# Patient Record
Sex: Female | Born: 1965 | Race: White | Hispanic: No | Marital: Single | Smoking: Former smoker
Health system: Southern US, Community
[De-identification: ages and names within clinical notes are randomized; demographics above are authoritative.]

## PROBLEM LIST (undated history)

## (undated) DIAGNOSIS — F419 Anxiety disorder, unspecified: Secondary | ICD-10-CM

## (undated) DIAGNOSIS — T7840XA Allergy, unspecified, initial encounter: Secondary | ICD-10-CM

## (undated) DIAGNOSIS — E7800 Pure hypercholesterolemia, unspecified: Secondary | ICD-10-CM

## (undated) DIAGNOSIS — E063 Autoimmune thyroiditis: Secondary | ICD-10-CM

## (undated) DIAGNOSIS — E785 Hyperlipidemia, unspecified: Secondary | ICD-10-CM

## (undated) DIAGNOSIS — K5900 Constipation, unspecified: Secondary | ICD-10-CM

## (undated) DIAGNOSIS — M810 Age-related osteoporosis without current pathological fracture: Secondary | ICD-10-CM

## (undated) DIAGNOSIS — L900 Lichen sclerosus et atrophicus: Secondary | ICD-10-CM

## (undated) DIAGNOSIS — E039 Hypothyroidism, unspecified: Secondary | ICD-10-CM

## (undated) HISTORY — DX: Autoimmune thyroiditis: E06.3

## (undated) HISTORY — DX: Hypothyroidism, unspecified: E03.9

## (undated) HISTORY — PX: CERVIX SURGERY: SHX593

## (undated) HISTORY — DX: Lichen sclerosus et atrophicus: L90.0

## (undated) HISTORY — DX: Allergy, unspecified, initial encounter: T78.40XA

## (undated) HISTORY — DX: Constipation, unspecified: K59.00

## (undated) HISTORY — DX: Age-related osteoporosis without current pathological fracture: M81.0

## (undated) HISTORY — DX: Hyperlipidemia, unspecified: E78.5

## (undated) HISTORY — DX: Pure hypercholesterolemia, unspecified: E78.00

## (undated) HISTORY — DX: Anxiety disorder, unspecified: F41.9

---

## 2007-06-02 ENCOUNTER — Ambulatory Visit (HOSPITAL_COMMUNITY): Admission: RE | Admit: 2007-06-02 | Discharge: 2007-06-02 | Payer: Self-pay | Admitting: Obstetrics and Gynecology

## 2010-05-13 ENCOUNTER — Other Ambulatory Visit (HOSPITAL_COMMUNITY): Payer: Self-pay | Admitting: Obstetrics and Gynecology

## 2010-05-13 DIAGNOSIS — Z139 Encounter for screening, unspecified: Secondary | ICD-10-CM

## 2010-05-16 ENCOUNTER — Ambulatory Visit (HOSPITAL_COMMUNITY)
Admission: RE | Admit: 2010-05-16 | Discharge: 2010-05-16 | Disposition: A | Discharge status: Home / Self Care | Payer: BC Managed Care – PPO | Source: Ambulatory Visit | Attending: Obstetrics and Gynecology | Admitting: Obstetrics and Gynecology

## 2010-05-16 ENCOUNTER — Ambulatory Visit (HOSPITAL_COMMUNITY): Payer: Self-pay

## 2010-05-16 DIAGNOSIS — Z139 Encounter for screening, unspecified: Secondary | ICD-10-CM

## 2010-05-16 DIAGNOSIS — Z1231 Encounter for screening mammogram for malignant neoplasm of breast: Secondary | ICD-10-CM | POA: Insufficient documentation

## 2011-04-07 ENCOUNTER — Other Ambulatory Visit (HOSPITAL_COMMUNITY): Payer: Self-pay | Admitting: Obstetrics and Gynecology

## 2011-04-07 DIAGNOSIS — Z139 Encounter for screening, unspecified: Secondary | ICD-10-CM

## 2011-05-19 ENCOUNTER — Ambulatory Visit (HOSPITAL_COMMUNITY)
Admission: RE | Admit: 2011-05-19 | Discharge: 2011-05-19 | Disposition: A | Discharge status: Home / Self Care | Payer: BC Managed Care – PPO | Source: Ambulatory Visit | Attending: Obstetrics and Gynecology | Admitting: Obstetrics and Gynecology

## 2011-05-19 DIAGNOSIS — Z139 Encounter for screening, unspecified: Secondary | ICD-10-CM

## 2011-05-19 DIAGNOSIS — N6459 Other signs and symptoms in breast: Secondary | ICD-10-CM | POA: Insufficient documentation

## 2011-05-19 DIAGNOSIS — Z1231 Encounter for screening mammogram for malignant neoplasm of breast: Secondary | ICD-10-CM | POA: Insufficient documentation

## 2011-05-20 ENCOUNTER — Other Ambulatory Visit: Payer: Self-pay | Admitting: Obstetrics and Gynecology

## 2011-05-20 DIAGNOSIS — R928 Other abnormal and inconclusive findings on diagnostic imaging of breast: Secondary | ICD-10-CM

## 2011-06-04 ENCOUNTER — Ambulatory Visit (HOSPITAL_COMMUNITY)
Admission: RE | Admit: 2011-06-04 | Discharge: 2011-06-04 | Disposition: A | Discharge status: Home / Self Care | Payer: BC Managed Care – PPO | Source: Ambulatory Visit | Attending: Obstetrics and Gynecology | Admitting: Obstetrics and Gynecology

## 2011-06-04 ENCOUNTER — Other Ambulatory Visit: Payer: Self-pay | Admitting: Obstetrics and Gynecology

## 2011-06-04 DIAGNOSIS — R928 Other abnormal and inconclusive findings on diagnostic imaging of breast: Secondary | ICD-10-CM

## 2011-09-05 ENCOUNTER — Encounter (INDEPENDENT_AMBULATORY_CARE_PROVIDER_SITE_OTHER): Payer: Self-pay | Admitting: *Deleted

## 2011-10-31 ENCOUNTER — Encounter (INDEPENDENT_AMBULATORY_CARE_PROVIDER_SITE_OTHER): Payer: Self-pay | Admitting: *Deleted

## 2011-10-31 ENCOUNTER — Other Ambulatory Visit (INDEPENDENT_AMBULATORY_CARE_PROVIDER_SITE_OTHER): Payer: Self-pay | Admitting: *Deleted

## 2011-10-31 ENCOUNTER — Telehealth (INDEPENDENT_AMBULATORY_CARE_PROVIDER_SITE_OTHER): Payer: Self-pay | Admitting: *Deleted

## 2011-10-31 DIAGNOSIS — Z1211 Encounter for screening for malignant neoplasm of colon: Secondary | ICD-10-CM

## 2011-10-31 DIAGNOSIS — Z800 Family history of malignant neoplasm of digestive organs: Secondary | ICD-10-CM
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
## 2011-11-03 MED ORDER — PEG-KCL-NACL-NASULF-NA ASC-C 100 G PO SOLR: 1.0000 | Freq: Once | ORAL | Status: DC

## 2011-12-25 ENCOUNTER — Ambulatory Visit: Admit: 2011-12-25 | Payer: BC Managed Care – PPO | Admitting: Internal Medicine

## 2011-12-25 SURGERY — COLONOSCOPY
Anesthesia: Moderate Sedation

## 2012-01-07 ENCOUNTER — Other Ambulatory Visit (HOSPITAL_COMMUNITY): Payer: Self-pay | Admitting: Internal Medicine

## 2012-01-07 DIAGNOSIS — N200 Calculus of kidney: Secondary | ICD-10-CM

## 2012-01-12 ENCOUNTER — Ambulatory Visit (HOSPITAL_COMMUNITY): Payer: BC Managed Care – PPO

## 2012-05-31 ENCOUNTER — Other Ambulatory Visit (HOSPITAL_COMMUNITY): Payer: Self-pay | Admitting: Internal Medicine

## 2012-05-31 DIAGNOSIS — Z139 Encounter for screening, unspecified: Secondary | ICD-10-CM

## 2012-06-10 ENCOUNTER — Other Ambulatory Visit (HOSPITAL_COMMUNITY): Payer: Self-pay | Admitting: Internal Medicine

## 2012-06-10 ENCOUNTER — Ambulatory Visit (HOSPITAL_COMMUNITY): Payer: BC Managed Care – PPO

## 2012-06-10 DIAGNOSIS — Z1231 Encounter for screening mammogram for malignant neoplasm of breast: Secondary | ICD-10-CM

## 2012-06-28 ENCOUNTER — Ambulatory Visit
Admission: RE | Admit: 2012-06-28 | Discharge: 2012-06-28 | Disposition: A | Discharge status: Home / Self Care | Payer: BC Managed Care – PPO | Source: Ambulatory Visit | Attending: Internal Medicine | Admitting: Internal Medicine

## 2012-06-28 DIAGNOSIS — Z1231 Encounter for screening mammogram for malignant neoplasm of breast: Secondary | ICD-10-CM

## 2016-03-05 DIAGNOSIS — J0100 Acute maxillary sinusitis, unspecified: Secondary | ICD-10-CM | POA: Diagnosis not present

## 2016-04-02 DIAGNOSIS — E039 Hypothyroidism, unspecified: Secondary | ICD-10-CM | POA: Diagnosis not present

## 2016-05-07 DIAGNOSIS — R300 Dysuria: Secondary | ICD-10-CM | POA: Diagnosis not present

## 2016-05-16 DIAGNOSIS — Z01419 Encounter for gynecological examination (general) (routine) without abnormal findings: Secondary | ICD-10-CM | POA: Diagnosis not present

## 2016-05-16 DIAGNOSIS — Z713 Dietary counseling and surveillance: Secondary | ICD-10-CM | POA: Diagnosis not present

## 2016-06-11 DIAGNOSIS — Z713 Dietary counseling and surveillance: Secondary | ICD-10-CM | POA: Diagnosis not present

## 2016-06-12 DIAGNOSIS — Z1231 Encounter for screening mammogram for malignant neoplasm of breast: Secondary | ICD-10-CM | POA: Diagnosis not present

## 2016-06-26 DIAGNOSIS — L02224 Furuncle of groin: Secondary | ICD-10-CM | POA: Diagnosis not present

## 2016-06-26 DIAGNOSIS — L578 Other skin changes due to chronic exposure to nonionizing radiation: Secondary | ICD-10-CM | POA: Diagnosis not present

## 2016-06-26 DIAGNOSIS — L648 Other androgenic alopecia: Secondary | ICD-10-CM | POA: Diagnosis not present

## 2016-07-09 DIAGNOSIS — Z713 Dietary counseling and surveillance: Secondary | ICD-10-CM | POA: Diagnosis not present

## 2016-11-13 DIAGNOSIS — N952 Postmenopausal atrophic vaginitis: Secondary | ICD-10-CM | POA: Diagnosis not present

## 2016-11-13 DIAGNOSIS — L298 Other pruritus: Secondary | ICD-10-CM | POA: Diagnosis not present

## 2016-12-25 DIAGNOSIS — E063 Autoimmune thyroiditis: Secondary | ICD-10-CM | POA: Diagnosis not present

## 2017-02-24 ENCOUNTER — Emergency Department (HOSPITAL_COMMUNITY)
Admission: EM | Admit: 2017-02-24 | Discharge: 2017-02-24 | Disposition: A | Discharge status: Home / Self Care | Payer: 59 | Attending: Emergency Medicine | Admitting: Emergency Medicine

## 2017-02-24 ENCOUNTER — Emergency Department (HOSPITAL_COMMUNITY): Payer: 59

## 2017-02-24 DIAGNOSIS — Y998 Other external cause status: Secondary | ICD-10-CM | POA: Insufficient documentation

## 2017-02-24 DIAGNOSIS — S199XXA Unspecified injury of neck, initial encounter: Secondary | ICD-10-CM | POA: Diagnosis not present

## 2017-02-24 DIAGNOSIS — S4992XA Unspecified injury of left shoulder and upper arm, initial encounter: Secondary | ICD-10-CM | POA: Diagnosis not present

## 2017-02-24 DIAGNOSIS — T148XXA Other injury of unspecified body region, initial encounter: Secondary | ICD-10-CM | POA: Diagnosis not present

## 2017-02-24 DIAGNOSIS — Y92410 Unspecified street and highway as the place of occurrence of the external cause: Secondary | ICD-10-CM | POA: Diagnosis not present

## 2017-02-24 DIAGNOSIS — M25512 Pain in left shoulder: Secondary | ICD-10-CM | POA: Diagnosis not present

## 2017-02-24 DIAGNOSIS — Y9389 Activity, other specified: Secondary | ICD-10-CM | POA: Insufficient documentation

## 2017-02-24 DIAGNOSIS — M5489 Other dorsalgia: Secondary | ICD-10-CM | POA: Diagnosis not present

## 2017-02-24 DIAGNOSIS — M542 Cervicalgia: Secondary | ICD-10-CM | POA: Insufficient documentation

## 2017-02-24 DIAGNOSIS — S3992XA Unspecified injury of lower back, initial encounter: Secondary | ICD-10-CM | POA: Insufficient documentation

## 2017-02-24 MED ORDER — METHOCARBAMOL 500 MG PO TABS: 500.0000 mg | ORAL_TABLET | Freq: Two times a day (BID) | ORAL | 0 refills | Status: DC

## 2017-02-24 MED ORDER — NAPROXEN 500 MG PO TABS: 500.0000 mg | ORAL_TABLET | Freq: Two times a day (BID) | ORAL | 0 refills | Status: DC

## 2017-02-24 MED ORDER — METHOCARBAMOL 500 MG PO TABS
750.0000 mg | ORAL_TABLET | Freq: Once | ORAL | Status: AC
Start: 2017-02-24 — End: 2017-02-24
  Administered 2017-02-24: 750 mg via ORAL
  Filled 2017-02-24: qty 2

## 2017-02-24 MED ORDER — IBUPROFEN 400 MG PO TABS
600.0000 mg | ORAL_TABLET | Freq: Once | ORAL | Status: AC
  Administered 2017-02-24: 600 mg via ORAL
  Filled 2017-02-24: qty 1

## 2017-02-24 NOTE — ED Notes (Signed)
Patient transported to X-ray 
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
## 2017-02-27 DIAGNOSIS — E039 Hypothyroidism, unspecified: Secondary | ICD-10-CM | POA: Diagnosis not present

## 2017-03-13 DIAGNOSIS — E039 Hypothyroidism, unspecified: Secondary | ICD-10-CM | POA: Diagnosis not present

## 2017-03-13 DIAGNOSIS — S199XXA Unspecified injury of neck, initial encounter: Secondary | ICD-10-CM | POA: Diagnosis not present

## 2017-03-20 DIAGNOSIS — Z0000 Encounter for general adult medical examination without abnormal findings: Secondary | ICD-10-CM | POA: Diagnosis not present

## 2017-03-20 DIAGNOSIS — E063 Autoimmune thyroiditis: Secondary | ICD-10-CM | POA: Diagnosis not present

## 2017-03-20 DIAGNOSIS — M542 Cervicalgia: Secondary | ICD-10-CM | POA: Diagnosis not present

## 2017-04-22 DIAGNOSIS — M545 Low back pain, unspecified: Secondary | ICD-10-CM | POA: Insufficient documentation

## 2017-05-10 DIAGNOSIS — N390 Urinary tract infection, site not specified: Secondary | ICD-10-CM | POA: Diagnosis not present

## 2017-06-30 DIAGNOSIS — N898 Other specified noninflammatory disorders of vagina: Secondary | ICD-10-CM | POA: Diagnosis not present

## 2017-07-07 DIAGNOSIS — N952 Postmenopausal atrophic vaginitis: Secondary | ICD-10-CM | POA: Diagnosis not present

## 2017-09-23 DIAGNOSIS — E782 Mixed hyperlipidemia: Secondary | ICD-10-CM | POA: Diagnosis not present

## 2017-09-23 DIAGNOSIS — E063 Autoimmune thyroiditis: Secondary | ICD-10-CM | POA: Diagnosis not present

## 2017-09-23 DIAGNOSIS — E039 Hypothyroidism, unspecified: Secondary | ICD-10-CM | POA: Diagnosis not present

## 2017-09-23 DIAGNOSIS — Z0000 Encounter for general adult medical examination without abnormal findings: Secondary | ICD-10-CM | POA: Diagnosis not present

## 2017-09-23 DIAGNOSIS — M542 Cervicalgia: Secondary | ICD-10-CM | POA: Diagnosis not present

## 2017-09-30 DIAGNOSIS — E6609 Other obesity due to excess calories: Secondary | ICD-10-CM | POA: Diagnosis not present

## 2017-09-30 DIAGNOSIS — Z6836 Body mass index (BMI) 36.0-36.9, adult: Secondary | ICD-10-CM | POA: Diagnosis not present

## 2017-09-30 DIAGNOSIS — E782 Mixed hyperlipidemia: Secondary | ICD-10-CM | POA: Diagnosis not present

## 2017-09-30 DIAGNOSIS — E038 Other specified hypothyroidism: Secondary | ICD-10-CM | POA: Diagnosis not present

## 2017-10-06 DIAGNOSIS — R300 Dysuria: Secondary | ICD-10-CM | POA: Diagnosis not present

## 2017-10-06 DIAGNOSIS — N343 Urethral syndrome, unspecified: Secondary | ICD-10-CM | POA: Diagnosis not present

## 2017-10-06 DIAGNOSIS — Z6834 Body mass index (BMI) 34.0-34.9, adult: Secondary | ICD-10-CM | POA: Diagnosis not present

## 2017-10-24 DIAGNOSIS — Z6834 Body mass index (BMI) 34.0-34.9, adult: Secondary | ICD-10-CM | POA: Diagnosis not present

## 2017-10-24 DIAGNOSIS — R300 Dysuria: Secondary | ICD-10-CM | POA: Diagnosis not present

## 2017-10-24 DIAGNOSIS — N760 Acute vaginitis: Secondary | ICD-10-CM | POA: Diagnosis not present

## 2017-11-02 DIAGNOSIS — R300 Dysuria: Secondary | ICD-10-CM | POA: Diagnosis not present

## 2017-11-02 DIAGNOSIS — N390 Urinary tract infection, site not specified: Secondary | ICD-10-CM | POA: Diagnosis not present

## 2017-11-02 DIAGNOSIS — R319 Hematuria, unspecified: Secondary | ICD-10-CM | POA: Diagnosis not present

## 2017-11-04 DIAGNOSIS — Z6836 Body mass index (BMI) 36.0-36.9, adult: Secondary | ICD-10-CM | POA: Diagnosis not present

## 2017-11-04 DIAGNOSIS — N390 Urinary tract infection, site not specified: Secondary | ICD-10-CM | POA: Diagnosis not present

## 2017-11-04 DIAGNOSIS — R300 Dysuria: Secondary | ICD-10-CM | POA: Diagnosis not present

## 2017-11-04 DIAGNOSIS — E669 Obesity, unspecified: Secondary | ICD-10-CM | POA: Diagnosis not present

## 2017-11-11 DIAGNOSIS — N898 Other specified noninflammatory disorders of vagina: Secondary | ICD-10-CM | POA: Diagnosis not present

## 2017-11-11 DIAGNOSIS — R300 Dysuria: Secondary | ICD-10-CM | POA: Diagnosis not present

## 2017-11-11 DIAGNOSIS — Z23 Encounter for immunization: Secondary | ICD-10-CM | POA: Diagnosis not present

## 2017-11-11 DIAGNOSIS — B373 Candidiasis of vulva and vagina: Secondary | ICD-10-CM | POA: Diagnosis not present

## 2017-11-11 DIAGNOSIS — N760 Acute vaginitis: Secondary | ICD-10-CM | POA: Diagnosis not present

## 2017-11-11 DIAGNOSIS — E1165 Type 2 diabetes mellitus with hyperglycemia: Secondary | ICD-10-CM | POA: Diagnosis not present

## 2017-11-12 DIAGNOSIS — R300 Dysuria: Secondary | ICD-10-CM | POA: Diagnosis not present

## 2017-11-12 DIAGNOSIS — E669 Obesity, unspecified: Secondary | ICD-10-CM | POA: Diagnosis not present

## 2017-11-12 DIAGNOSIS — M542 Cervicalgia: Secondary | ICD-10-CM | POA: Diagnosis not present

## 2017-11-12 DIAGNOSIS — Z0000 Encounter for general adult medical examination without abnormal findings: Secondary | ICD-10-CM | POA: Diagnosis not present

## 2017-11-25 DIAGNOSIS — Z6834 Body mass index (BMI) 34.0-34.9, adult: Secondary | ICD-10-CM | POA: Diagnosis not present

## 2017-11-25 DIAGNOSIS — L900 Lichen sclerosus et atrophicus: Secondary | ICD-10-CM | POA: Diagnosis not present

## 2017-12-02 DIAGNOSIS — L908 Other atrophic disorders of skin: Secondary | ICD-10-CM | POA: Diagnosis not present

## 2017-12-02 DIAGNOSIS — L900 Lichen sclerosus et atrophicus: Secondary | ICD-10-CM | POA: Diagnosis not present

## 2017-12-13 DIAGNOSIS — H1032 Unspecified acute conjunctivitis, left eye: Secondary | ICD-10-CM | POA: Diagnosis not present

## 2017-12-13 DIAGNOSIS — Z6833 Body mass index (BMI) 33.0-33.9, adult: Secondary | ICD-10-CM | POA: Diagnosis not present

## 2017-12-13 DIAGNOSIS — B001 Herpesviral vesicular dermatitis: Secondary | ICD-10-CM | POA: Diagnosis not present

## 2017-12-28 DIAGNOSIS — Z01419 Encounter for gynecological examination (general) (routine) without abnormal findings: Secondary | ICD-10-CM | POA: Diagnosis not present

## 2017-12-28 DIAGNOSIS — Z6833 Body mass index (BMI) 33.0-33.9, adult: Secondary | ICD-10-CM | POA: Diagnosis not present

## 2018-01-06 DIAGNOSIS — E6609 Other obesity due to excess calories: Secondary | ICD-10-CM | POA: Diagnosis not present

## 2018-01-06 DIAGNOSIS — L900 Lichen sclerosus et atrophicus: Secondary | ICD-10-CM | POA: Diagnosis not present

## 2018-01-06 DIAGNOSIS — Z6833 Body mass index (BMI) 33.0-33.9, adult: Secondary | ICD-10-CM | POA: Diagnosis not present

## 2018-01-25 DIAGNOSIS — E039 Hypothyroidism, unspecified: Secondary | ICD-10-CM | POA: Diagnosis not present

## 2019-04-14 LAB — LIPID PANEL
Cholesterol: 285 — AB (ref 0–200)
HDL: 53 (ref 35–70)
LDL Cholesterol: 187
Triglycerides: 244 — AB (ref 40–160)

## 2019-04-14 LAB — TSH
TSH: 0.02 — AB (ref 0.41–5.90)
TSH: 0.02 — AB (ref 0.41–5.90)

## 2019-05-31 ENCOUNTER — Encounter: Payer: Self-pay | Admitting: "Endocrinology

## 2019-05-31 ENCOUNTER — Ambulatory Visit (INDEPENDENT_AMBULATORY_CARE_PROVIDER_SITE_OTHER): Payer: No Typology Code available for payment source | Admitting: "Endocrinology

## 2019-05-31 ENCOUNTER — Other Ambulatory Visit: Payer: Self-pay

## 2019-05-31 VITALS — BP 130/84 | HR 72 | Ht 68.5 in | Wt 219.8 lb

## 2019-05-31 DIAGNOSIS — E039 Hypothyroidism, unspecified: Secondary | ICD-10-CM

## 2019-05-31 MED ORDER — LEVOTHYROXINE SODIUM 150 MCG PO TABS: 150.0000 ug | ORAL_TABLET | Freq: Every day | ORAL | 2 refills | Status: DC

## 2019-05-31 NOTE — Progress Notes (Signed)
Endocrinology Consult Note                                         05/31/2019, 6:26 PM   Leslie Landry is a 54 y.o.-year-old female patient being seen in consultation for hypothyroidism . PMD Dr. Nevada Crane   Past Medical History:  Diagnosis Date  . Anxiety   . Hyperlipidemia   . Hypothyroidism     Past Surgical History:  Procedure Laterality Date  . CERVIX SURGERY      Social History   Socioeconomic History  . Marital status: Widowed    Spouse name: Not on file  . Number of children: Not on file  . Years of education: Not on file  . Highest education level: Not on file  Occupational History  . Not on file  Tobacco Use  . Smoking status: Never Smoker  Substance and Sexual Activity  . Alcohol use: Not on file  . Drug use: Not on file  . Sexual activity: Not on file  Other Topics Concern  . Not on file  Social History Narrative  . Not on file   Social Determinants of Health   Financial Resource Strain:   . Difficulty of Paying Living Expenses:   Food Insecurity:   . Worried About Charity fundraiser in the Last Year:   . Arboriculturist in the Last Year:   Transportation Needs:   . Film/video editor (Medical):   Marland Kitchen Lack of Transportation (Non-Medical):   Physical Activity:   . Days of Exercise per Week:   . Minutes of Exercise per Session:   Stress:   . Feeling of Stress :   Social Connections:   . Frequency of Communication with Friends and Family:   . Frequency of Social Gatherings with Friends and Family:   . Attends Religious Services:   . Active Member of Clubs or Organizations:   . Attends Archivist Meetings:   Marland Kitchen Marital Status:     Family History  Problem Relation Age of Onset  . Diabetes Mother   . Thyroid disease Mother   . Hypertension Mother   . Heart failure Mother   . Hypertension Father   . Hyperlipidemia Father   . Stroke Father      Outpatient Encounter Medications as of 05/31/2019  Medication Sig  . albuterol (VENTOLIN HFA) 108 (90 Base) MCG/ACT inhaler Inhale 2 puffs into the lungs every 4 (four) hours as needed for wheezing or shortness of breath.  . levothyroxine (SYNTHROID) 150 MCG tablet Take 1 tablet (150 mcg total) by mouth daily before breakfast.  . Loratadine 10 MG CAPS Take 2 capsules by mouth daily.  . [DISCONTINUED] levothyroxine (SYNTHROID) 175 MCG tablet 1 tablet daily.  . [DISCONTINUED] liothyronine (CYTOMEL) 25 MCG tablet Take 1 tablet by mouth daily.  . naproxen (NAPROSYN) 500 MG tablet Take 1 tablet (500 mg total) by mouth 2 (two) times daily.  Marland Kitchen  phentermine (ADIPEX-P) 37.5 MG tablet Take 37.5 mg by mouth every morning.  . rosuvastatin (CRESTOR) 20 MG tablet Take 20 mg by mouth at bedtime.  . [DISCONTINUED] methocarbamol (ROBAXIN) 500 MG tablet Take 1 tablet (500 mg total) by mouth 2 (two) times daily.  . [DISCONTINUED] peg 3350 powder (MOVIPREP) 100 G SOLR Take 1 kit (100 g total) by mouth once.   No facility-administered encounter medications on file as of 05/31/2019.    ALLERGIES: Allergies  Allergen Reactions  . Hydrocodone    VACCINATION STATUS:  There is no immunization history on file for this patient.   HPI    Leslie Landry  is a patient with the above medical history. she was diagnosed  with hypothyroidism at approximate age of 32 years. She was treated with various dose of levothyroxine over the years, currently on levothyroxine 175 mcg p.o. daily and Cytomel 25 mg daily.  Her most recent thyroid function tests are consistent with over replacement. -She reports fluctuating body weight,and anxiety. -She denies any prior history of goiter, family history of malignancy of the thyroid. I reviewed patient's  thyroid tests:  Lab Results  Component Value Date   TSH 0.02 (A) 04/14/2019   TSH 0.02 (A) 04/14/2019     Pt denies feeling nodules in neck, hoarseness,  dysphagia/odynophagia, SOB with lying down.  she reports family history of thyroid disorder in her mother.  No recent use of iodine supplements.  ROS:  Constitutional:  + Fluctuating body weight,   + fatigue, no subjective hyperthermia, no subjective hypothermia Eyes: no blurry vision, no xerophthalmia ENT: no sore throat, no nodules palpated in throat, no dysphagia/odynophagia, no hoarseness Cardiovascular: no Chest Pain, no Shortness of Breath, no palpitations, no leg swelling Respiratory: no cough, no SOB Gastrointestinal: no Nausea/Vomiting/Diarhhea Musculoskeletal: no muscle/joint aches Skin: no rashes Neurological: no tremors, no numbness, no tingling, no dizziness Psychiatric: no depression, no anxiety   Physical Exam: BP 130/84   Pulse 72   Ht 5' 8.5" (1.74 m)   Wt 219 lb 12.8 oz (99.7 kg)   LMP 05/01/2011   BMI 32.93 kg/m  Wt Readings from Last 3 Encounters:  05/31/19 219 lb 12.8 oz (99.7 kg)    Constitutional:  Body mass index is 32.93 kg/m., not in acute distress, normal state of mind Eyes: PERRLA, EOMI, no exophthalmos ENT: moist mucous membranes, no thyromegaly, no cervical lymphadenopathy Cardiovascular: normal precordial activity, Regular Rate and Rhythm, no Murmur/Rubs/Gallops Respiratory:  adequate breathing efforts, no gross chest deformity, Clear to auscultation bilaterally Gastrointestinal: abdomen soft, Non -tender, No distension, Bowel Sounds present Musculoskeletal: no gross deformities, strength intact in all four extremities Skin: moist, warm, no rashes Neurological: no tremor with outstretched hands, Deep tendon reflexes normal in all four extremities.   Lab Results  Component Value Date   TSH 0.02 (A) 04/14/2019   TSH 0.02 (A) 04/14/2019    Free T4 direct by dialysis on April 14, 2019 was 1.2. Recent Results (from the past 2160 hour(s))  TSH     Status: Abnormal   Collection Time: 04/14/19 12:00 AM  Result Value Ref Range   TSH 0.02  (A) 0.41 - 5.90  Lipid panel     Status: Abnormal   Collection Time: 04/14/19 12:00 AM  Result Value Ref Range   Triglycerides 244 (A) 40 - 160   Cholesterol 285 (A) 0 - 200   HDL 53 35 - 70   LDL Cholesterol 187   TSH     Status:  Abnormal   Collection Time: 04/14/19 12:00 AM  Result Value Ref Range   TSH 0.02 (A) 0.41 - 5.90    Comment: ft4 1.2     ASSESSMENT: 1. Hypothyroidism  PLAN:    Patient with long-standing hypothyroidism, on levothyroxine therapy. On physical exam , patient  does not  have  gross goiter, thyroid nodules, or neck compression symptoms.  Her recent thyroid function tests are consistent with over replacement.  I discussed and lowered her levothyroxine to 150 mcg p.o. daily before breakfast and advised to discontinue the Cytomel. - We discussed about correct intake of levothyroxine, at fasting, with water, separated by at least 30 minutes from breakfast, and separated by more than 4 hours from calcium, iron, multivitamins, acid reflux medications (PPIs). -Patient is made aware of the fact that thyroid hormone replacement is needed for life, dose to be adjusted by periodic monitoring of thyroid function tests. - Will check thyroid tests before next visit: TSH, free T4 -Due to absence of clinical goiter, no need for thyroid ultrasound.  She is advised to be consistent with her Crestor 20 mg p.o. nightly given her severe dyslipidemia.  - Time spent with the patient: 45 minutes, of which >50% was spent in obtaining information about her symptoms, reviewing her previous labs, evaluations, and treatments, counseling her about her hypothyroidism, and developing a plan to confirm the diagnosis and long term treatment as necessary. Please refer to " Patient Self Inventory" in the Media  tab for reviewed elements of pertinent patient history.  Hildreth A Strine participated in the discussions, expressed understanding, and voiced agreement with the above plans.  All  questions were answered to her satisfaction. she is encouraged to contact clinic should she have any questions or concerns prior to her return visit.  Return in about 9 weeks (around 08/02/2019) for Follow up with Pre-visit Labs.  Glade Lloyd, MD [facility] [address] [place], [place] [address] Phone: [phone]  Fax: [phone]   05/31/2019, 6:26 PM  This note was partially dictated with voice recognition software. Similar sounding words can be transcribed inadequately or may not  be corrected upon review.

## 2019-08-09 ENCOUNTER — Ambulatory Visit: Payer: No Typology Code available for payment source | Admitting: "Endocrinology

## 2019-09-08 ENCOUNTER — Other Ambulatory Visit: Payer: Self-pay | Admitting: "Endocrinology

## 2019-10-28 ENCOUNTER — Telehealth: Payer: Self-pay | Admitting: Oncology

## 2019-10-28 NOTE — Telephone Encounter (Signed)
Called to Discuss with patient about Covid symptoms and the use of the monoclonal antibody infusion for those with mild to moderate Covid symptoms and at a high risk of hospitalization.     Pt is qualified for this infusion due to co-morbid conditions and/or a member of an at-risk group.     There are no problems to display for this patient.   Patient declines infusion at this time. Symptoms tier reviewed as well as criteria for ending isolation. Preventative practices reviewed. Patient verbalized understanding.    Patient advised to call back if he/she decides that he/she does want to get infusion. Callback number to the infusion center given. Patient advised to go to Urgent care or ED with severe symptoms.   Faythe Casa, NP 10/28/2019 2:05 PM

## 2020-05-01 ENCOUNTER — Ambulatory Visit (INDEPENDENT_AMBULATORY_CARE_PROVIDER_SITE_OTHER): Payer: No Typology Code available for payment source | Admitting: Bariatrics

## 2020-05-01 ENCOUNTER — Other Ambulatory Visit: Payer: Self-pay

## 2020-05-01 ENCOUNTER — Encounter (INDEPENDENT_AMBULATORY_CARE_PROVIDER_SITE_OTHER): Payer: Self-pay | Admitting: Bariatrics

## 2020-05-01 VITALS — BP 139/89 | HR 70 | Temp 97.7°F | Ht 68.0 in | Wt 228.0 lb

## 2020-05-01 DIAGNOSIS — Z1331 Encounter for screening for depression: Secondary | ICD-10-CM | POA: Diagnosis not present

## 2020-05-01 DIAGNOSIS — R0602 Shortness of breath: Secondary | ICD-10-CM | POA: Diagnosis not present

## 2020-05-01 DIAGNOSIS — Z6834 Body mass index (BMI) 34.0-34.9, adult: Secondary | ICD-10-CM

## 2020-05-01 DIAGNOSIS — E669 Obesity, unspecified: Secondary | ICD-10-CM | POA: Insufficient documentation

## 2020-05-01 DIAGNOSIS — Z9189 Other specified personal risk factors, not elsewhere classified: Secondary | ICD-10-CM | POA: Diagnosis not present

## 2020-05-01 DIAGNOSIS — E559 Vitamin D deficiency, unspecified: Secondary | ICD-10-CM

## 2020-05-01 DIAGNOSIS — Z0289 Encounter for other administrative examinations: Secondary | ICD-10-CM

## 2020-05-01 DIAGNOSIS — R7309 Other abnormal glucose: Secondary | ICD-10-CM

## 2020-05-01 DIAGNOSIS — E782 Mixed hyperlipidemia: Secondary | ICD-10-CM | POA: Insufficient documentation

## 2020-05-01 DIAGNOSIS — E063 Autoimmune thyroiditis: Secondary | ICD-10-CM | POA: Diagnosis not present

## 2020-05-01 DIAGNOSIS — R5383 Other fatigue: Secondary | ICD-10-CM | POA: Diagnosis not present

## 2020-05-02 ENCOUNTER — Encounter (INDEPENDENT_AMBULATORY_CARE_PROVIDER_SITE_OTHER): Payer: Self-pay | Admitting: Bariatrics

## 2020-05-02 DIAGNOSIS — E88819 Insulin resistance, unspecified: Secondary | ICD-10-CM | POA: Insufficient documentation

## 2020-05-02 DIAGNOSIS — E8881 Metabolic syndrome: Secondary | ICD-10-CM | POA: Insufficient documentation

## 2020-05-02 LAB — VITAMIN D 25 HYDROXY (VIT D DEFICIENCY, FRACTURES): Vit D, 25-Hydroxy: 37.2 ng/mL (ref 30.0–100.0)

## 2020-05-02 LAB — INSULIN, RANDOM: INSULIN: 37.3 u[IU]/mL — ABNORMAL HIGH (ref 2.6–24.9)

## 2020-05-02 NOTE — Progress Notes (Signed)
Dear Dr. [place] Landry,   Thank you for referring Leslie Landry to our clinic. The following note includes my evaluation and treatment recommendations.  Chief Complaint:   OBESITY Leslie Landry (MR# [id number]) is a 55 y.o. female who presents for evaluation and treatment of obesity and related comorbidities. Current BMI is Body mass index is 34.67 kg/m. Leslie Landry has been struggling with her weight for many years and has been unsuccessful in either losing weight, maintaining weight loss, or reaching her healthy weight goal.  Leslie Landry is currently in the action stage of change and ready to dedicate time achieving and maintaining a healthier weight. Leslie Landry is interested in becoming our patient and working on intensive lifestyle modifications including (but not limited to) diet and exercise for weight loss.  Leslie Landry states that she eats out everyday.  She craves chocolate and chips.  She snacks frequently between meals.  Leslie Landry habits were reviewed today and are as follows: Her family eats meals together, her desired weight loss is 76 pounds, she started gaining excessive weight after age 47, her heaviest weight ever was 250 pounds, she craves chocolate chips, she snacks frequently in the evenings, she skips lunch frequently, she is frequently drinking liquids with calories and she struggles with emotional eating.  Depression Screen Leslie Landry Food and Mood (modified PHQ-9) score was 7.  Depression screen [facility] 2/9 05/01/2020  Decreased Interest 0  Down, Depressed, Hopeless 0  PHQ - 2 Score 0  Altered sleeping 0  Tired, decreased energy 3  Change in appetite 3  Feeling bad or failure about yourself  0  Trouble concentrating 1  Moving slowly or fidgety/restless 0  Suicidal thoughts 0  PHQ-9 Score 7  Difficult doing work/chores Not difficult at all   Subjective:   1. Other fatigue Leslie Landry denies daytime somnolence and reports waking up still tired. Patent has a history of symptoms of morning  fatigue and snoring. Leslie Landry generally gets 5 hours of sleep per night, and states that she has poor quality sleep. Snoring is present. Apneic episodes is not present. Epworth Sleepiness Score is 11.    2. SOB (shortness of breath) on exertion Leslie Landry notes increasing shortness of breath with exercising and seems to be worsening over time with weight gain. She notes getting out of breath sooner with activity than she used to. This has gotten worse recently. Leslie Landry denies shortness of breath at rest or orthopnea.  3. Hashimoto's disease Leslie Landry is taking Synthroid 150 mcg daily.  Last TSH was low.  Lab Results  Component Value Date   TSH 0.02 (A) 04/14/2019   TSH 0.02 (A) 04/14/2019   4. Vitamin D deficiency She is currently taking OTC vitamin D each day. She denies nausea, vomiting or muscle weakness.  5. Elevated cholesterol with elevated triglycerides Leslie Landry has hyperlipidemia and has been trying to improve her cholesterol levels with intensive lifestyle modification including a low saturated fat diet, exercise and weight loss. She denies any chest pain, claudication or myalgias.  She is taking Crestor and fenofibrate.  Lab Results  Component Value Date   CHOL 285 (A) 04/14/2019   HDL 53 04/14/2019   LDLCALC 187 04/14/2019   TRIG 244 (A) 04/14/2019   6. Elevated glucose A1c was normal at 5.6.  7. Depression screen Leslie Landry was screened for depression as part of her new patient workup today.  PHQ-9 is 7.  8. At risk for activity intolerance Leslie Landry is at risk for activity intolerance due to obesity.  Assessment/Plan:  1. Other fatigue Leslie Landry does feel that her weight is causing her energy to be lower than it should be. Fatigue may be related to obesity, depression or many other causes. Labs will be ordered, and in the meanwhile, Leslie Landry will focus on self care including making healthy food choices, increasing physical activity and focusing on stress reduction.  Continue to increase  activities/exercise gradually.  - EKG 12-Lead - Insulin, random - VITAMIN D 25 Hydroxy (Vit-D Deficiency, Fractures)  2. SOB (shortness of breath) on exertion Leslie Landry does feel that she gets out of breath more easily that she used to when she exercises. Leslie Landry's shortness of breath appears to be obesity related and exercise induced. She has agreed to work on weight loss and gradually increase exercise to treat her exercise induced shortness of breath. Will continue to monitor closely.  Continue to increase activities/exercise gradually.  3. Hashimoto's disease Continue medication.  4. Vitamin D deficiency Will check vitamin D level today, as per below.  Continue OTC vitamin D.  - VITAMIN D 25 Hydroxy (Vit-D Deficiency, Fractures)  5. Elevated cholesterol with elevated triglycerides Cardiovascular risk and specific lipid/LDL goals reviewed.  We discussed several lifestyle modifications today and Leslie Landry will continue to work on diet, exercise and weight loss efforts. Orders and follow up as documented in patient record.  Continue medications.    Counseling Intensive lifestyle modifications are the first line treatment for this issue. . Dietary changes: Increase soluble fiber. Decrease simple carbohydrates. . Exercise changes: Moderate to vigorous-intensity aerobic activity 150 minutes per week if tolerated. . Lipid-lowering medications: see documented in medical record.  6. Elevated glucose Will check insulin level today, as per below..  - Insulin, random  7. Depression screen Leslie Landry had a positive depression screening. Depression is commonly associated with obesity and often results in emotional eating behaviors. We will monitor this closely and work on CBT to help improve the non-hunger eating patterns. Referral to Psychology may be required if no improvement is seen as she continues in our clinic.  8. At risk for activity intolerance Leslie Landry was given approximately 15 minutes of exercise  intolerance counseling today. She is 55 y.o. female and has risk factors exercise intolerance including obesity. We discussed intensive lifestyle modifications today with an emphasis on specific weight loss instructions and strategies. Leslie Landry will slowly increase activity as tolerated.  Repetitive spaced learning was employed today to elicit superior memory formation and behavioral change.  9. Class 1 obesity with serious comorbidity and body mass index (BMI) of 34.0 to 34.9 in adult, unspecified obesity type  Leslie Landry is currently in the action stage of change and her goal is to continue with weight loss efforts. I recommend Leslie Landry begin the structured treatment plan as follows:  She has agreed to the Category 3 Plan.  She will work on meal planning.  Labs from 04/14/2019, including TSH, and lipid panel, and from 04/18/2020, including A1c, CBC, and CMP, were reviewed today.  Exercise goals: No exercise has been prescribed at this time.   Behavioral modification strategies: increasing lean protein intake, decreasing simple carbohydrates, increasing vegetables, increasing water intake, decreasing eating out, no skipping meals, meal planning and cooking strategies, keeping healthy foods in the home and planning for success.  She was informed of the importance of frequent follow-up visits to maximize her success with intensive lifestyle modifications for her multiple health conditions. She was informed we would discuss her lab results at her next visit unless there is a critical issue that needs to  be addressed sooner. Leslie Landry agreed to keep her next visit at the agreed upon time to discuss these results.  Objective:   Blood pressure 139/89, pulse 70, temperature 97.7 F (36.5 C), height 5\' 8"  (1.727 m), weight 228 lb (103.4 kg), last menstrual period 05/01/2011, SpO2 97 %. Body mass index is 34.67 kg/m.  EKG: Normal sinus rhythm, rate 68 bpm.  Indirect Calorimeter completed today shows a VO2 of 311 and  a REE of 2166.  Her calculated basal metabolic rate is 0940 thus her basal metabolic rate is better than expected.  General: Cooperative, alert, well developed, in no acute distress. HEENT: Conjunctivae and lids unremarkable. Cardiovascular: Regular rhythm.  Lungs: Normal work of breathing. Neurologic: No focal deficits.   Lab Results  Component Value Date   INSULIN 37.3 (H) 05/01/2020   Lab Results  Component Value Date   TSH 0.02 (A) 04/14/2019   TSH 0.02 (A) 04/14/2019   Lab Results  Component Value Date   CHOL 285 (A) 04/14/2019   HDL 53 04/14/2019   LDLCALC 187 04/14/2019   TRIG 244 (A) 04/14/2019   Attestation Statements:   Reviewed by clinician on day of visit: allergies, medications, problem list, medical history, surgical history, family history, social history, and previous encounter notes.  I, Water quality scientist, CMA, am acting as Location manager for [employer], DO  I have reviewed the above documentation for accuracy and completeness, and I agree with the above. Jearld Lesch, DO

## 2020-05-15 ENCOUNTER — Other Ambulatory Visit: Payer: Self-pay

## 2020-05-15 ENCOUNTER — Encounter (INDEPENDENT_AMBULATORY_CARE_PROVIDER_SITE_OTHER): Payer: Self-pay | Admitting: Bariatrics

## 2020-05-15 ENCOUNTER — Ambulatory Visit (INDEPENDENT_AMBULATORY_CARE_PROVIDER_SITE_OTHER): Payer: No Typology Code available for payment source | Admitting: Bariatrics

## 2020-05-15 VITALS — BP 140/84 | HR 70 | Temp 97.5°F | Ht 68.0 in | Wt 227.0 lb

## 2020-05-15 DIAGNOSIS — Z9189 Other specified personal risk factors, not elsewhere classified: Secondary | ICD-10-CM | POA: Diagnosis not present

## 2020-05-15 DIAGNOSIS — E8881 Metabolic syndrome: Secondary | ICD-10-CM

## 2020-05-15 DIAGNOSIS — E559 Vitamin D deficiency, unspecified: Secondary | ICD-10-CM | POA: Diagnosis not present

## 2020-05-15 DIAGNOSIS — E6609 Other obesity due to excess calories: Secondary | ICD-10-CM

## 2020-05-15 DIAGNOSIS — Z6834 Body mass index (BMI) 34.0-34.9, adult: Secondary | ICD-10-CM | POA: Diagnosis not present

## 2020-05-15 MED ORDER — METFORMIN HCL 500 MG PO TABS: 500.0000 mg | ORAL_TABLET | Freq: Every day | ORAL | 0 refills | Status: DC

## 2020-05-15 MED ORDER — VITAMIN D (ERGOCALCIFEROL) 1.25 MG (50000 UNIT) PO CAPS: 50000.0000 [IU] | ORAL_CAPSULE | ORAL | 0 refills | Status: DC

## 2020-05-21 ENCOUNTER — Encounter (INDEPENDENT_AMBULATORY_CARE_PROVIDER_SITE_OTHER): Payer: Self-pay | Admitting: Bariatrics

## 2020-05-21 NOTE — Progress Notes (Signed)
Chief Complaint:   OBESITY Leslie Landry is here to discuss her progress with her obesity treatment plan along with follow-up of her obesity related diagnoses. Leslie Landry is on the Category 3 Plan and states she is following her eating plan approximately 50% of the time. Leslie Landry states she is walking for 60 minutes 5 times per week.  Today's visit was #: 2 Starting weight: 228 lbs Starting date: 05/01/2020 Today's weight: 227 lbs Today's date: 05/15/2020 Total lbs lost to date: 1 Total lbs lost since last in-office visit: 1  Interim History: Leslie Landry has been under more stress and she had a death in the family. She is drinking a shake for lunch.  Subjective:   1. Vitamin D insufficiency Leslie Landry is on Vit D, and her last  Vit D level was 37.2.  2. Insulin resistance Leslie Landry's last insulin level was 37.3.  3. At risk of diabetes mellitus Leslie Landry is at higher than average risk for developing diabetes due to obesity.   Assessment/Plan:   1. Vitamin D insufficiency Low Vitamin D level contributes to fatigue and are associated with obesity, breast, and colon cancer. We will refill prescription Vitamin D for 1 month. Leslie Landry will follow-up for routine testing of Vitamin D, at least 2-3 times per year to avoid over-replacement.  - Vitamin D, Ergocalciferol, (DRISDOL) 1.25 MG (50000 UNIT) CAPS capsule; Take 1 capsule (50,000 Units total) by mouth every 7 (seven) days.  Dispense: 4 capsule; Refill: 0  2. Insulin resistance Leslie Landry will continue to work on weight loss, exercise, and decreasing simple carbohydrates to help decrease the risk of diabetes. We will refill metformin for 1 month. We discussed insulin resistance and handout was given today. Leslie Landry agreed to follow-up with [place] as directed to closely monitor her progress.  - metFORMIN (GLUCOPHAGE) 500 MG tablet; Take 1 tablet (500 mg total) by mouth daily with supper.  Dispense: 30 tablet; Refill: 0  3. At risk of diabetes mellitus Leslie Landry was given  approximately 15 minutes of diabetes education and counseling today. We discussed intensive lifestyle modifications today with an emphasis on weight loss as well as increasing exercise and decreasing simple carbohydrates in her diet. We also reviewed medication options with an emphasis on risk versus benefit of those discussed.   Repetitive spaced learning was employed today to elicit superior memory formation and behavioral change.  4. Obesity current BMI 38 Leslie Landry is currently in the action stage of change. As such, her goal is to continue with weight loss efforts. She has agreed to the Category 3 Plan.   Intentional eating was discussed. I reviewed labs from 05/01/2020 with the patient today. Eating Out sheet was given.  Exercise goals: As is.  Behavioral modification strategies: increasing lean protein intake, decreasing simple carbohydrates, increasing vegetables, increasing water intake, decreasing eating out, no skipping meals, meal planning and cooking strategies, keeping healthy foods in the home and planning for success.  Leslie Landry has agreed to follow-up with our clinic in 2 weeks. She was informed of the importance of frequent follow-up visits to maximize her success with intensive lifestyle modifications for her multiple health conditions.   Objective:   Blood pressure 140/84, pulse 70, temperature (!) 97.5 F (36.4 C), height 5\' 8"  (1.727 m), weight 227 lb (103 kg), last menstrual period 05/01/2011, SpO2 97 %. Body mass index is 34.52 kg/m.  General: Cooperative, alert, well developed, in no acute distress. HEENT: Conjunctivae and lids unremarkable. Cardiovascular: Regular rhythm.  Lungs: Normal work of breathing. Neurologic: No  focal deficits.   No results found for: CREATININE, BUN, NA, K, CL, CO2 No results found for: ALT, AST, GGT, ALKPHOS, BILITOT No results found for: HGBA1C Lab Results  Component Value Date   INSULIN 37.3 (H) 05/01/2020   Lab Results  Component  Value Date   TSH 0.02 (A) 04/14/2019   TSH 0.02 (A) 04/14/2019   Lab Results  Component Value Date   CHOL 285 (A) 04/14/2019   HDL 53 04/14/2019   LDLCALC 187 04/14/2019   TRIG 244 (A) 04/14/2019   No results found for: WBC, HGB, HCT, MCV, PLT No results found for: IRON, TIBC, FERRITIN  Attestation Statements:   Reviewed by clinician on day of visit: allergies, medications, problem list, medical history, surgical history, family history, social history, and previous encounter notes.   Wilhemena Durie, am acting as Location manager for [employer], DO.  I have reviewed the above documentation for accuracy and completeness, and I agree with the above. Jearld Lesch, DO

## 2020-05-29 ENCOUNTER — Other Ambulatory Visit: Payer: Self-pay

## 2020-05-29 ENCOUNTER — Ambulatory Visit (INDEPENDENT_AMBULATORY_CARE_PROVIDER_SITE_OTHER): Payer: No Typology Code available for payment source | Admitting: Bariatrics

## 2020-05-29 ENCOUNTER — Encounter (INDEPENDENT_AMBULATORY_CARE_PROVIDER_SITE_OTHER): Payer: Self-pay | Admitting: Bariatrics

## 2020-05-29 VITALS — BP 145/87 | HR 81 | Temp 97.8°F | Ht 68.0 in | Wt 227.0 lb

## 2020-05-29 DIAGNOSIS — Z6834 Body mass index (BMI) 34.0-34.9, adult: Secondary | ICD-10-CM

## 2020-05-29 DIAGNOSIS — Z9189 Other specified personal risk factors, not elsewhere classified: Secondary | ICD-10-CM

## 2020-05-29 DIAGNOSIS — E559 Vitamin D deficiency, unspecified: Secondary | ICD-10-CM | POA: Diagnosis not present

## 2020-05-29 DIAGNOSIS — E6609 Other obesity due to excess calories: Secondary | ICD-10-CM | POA: Diagnosis not present

## 2020-05-29 DIAGNOSIS — E8881 Metabolic syndrome: Secondary | ICD-10-CM

## 2020-05-29 MED ORDER — VITAMIN D (ERGOCALCIFEROL) 1.25 MG (50000 UNIT) PO CAPS: 50000.0000 [IU] | ORAL_CAPSULE | ORAL | 0 refills | Status: AC

## 2020-05-29 MED ORDER — METFORMIN HCL 500 MG PO TABS: 500.0000 mg | ORAL_TABLET | Freq: Every day | ORAL | 0 refills | Status: AC

## 2020-05-31 ENCOUNTER — Encounter (INDEPENDENT_AMBULATORY_CARE_PROVIDER_SITE_OTHER): Payer: Self-pay | Admitting: Bariatrics

## 2020-05-31 NOTE — Progress Notes (Signed)
Chief Complaint:   OBESITY Leslie Landry is here to discuss her progress with her obesity treatment plan along with follow-up of her obesity related diagnoses. Leslie Landry is on the Category 1 Plan and states she is following her eating plan approximately 40-50% of the time. Leslie Landry states she is walking for 2 hours 5 times per week.  Today's visit was #: 3 Starting weight: 228 lbs Starting date: 05/01/2020 Today's weight: 227 lbs Today's date: 05/29/2020 Total lbs lost to date: 1 lb Total lbs lost since last in-office visit: 0  Interim History: Leslie Landry's weight remains the same.  She remains "rushed" (keeps grandchildren and daughter in rehab).  She is getting "carb smart".  Subjective:   1. Insulin resistance Leslie Landry has a diagnosis of insulin resistance based on her elevated fasting insulin level >5. She continues to work on diet and exercise to decrease her risk of diabetes.  She is taking metformin and tolerating it well.  Lab Results  Component Value Date   INSULIN 37.3 (H) 05/01/2020   2. Vitamin D insufficiency Leslie Landry's Vitamin D level was 37.2 on 05/01/2020. She is currently taking prescription vitamin D 50,000 IU each week. She denies nausea, vomiting or muscle weakness.  She is taking her vitamin D as directed.  3. At risk for osteoporosis Leslie Landry is at higher risk of osteopenia and osteoporosis due to Vitamin D deficiency.   Assessment/Plan:   1. Insulin resistance Leslie Landry will continue to work on weight loss, exercise, and decreasing simple carbohydrates to help decrease the risk of diabetes. Leslie Landry agreed to follow-up with [place] as directed to closely monitor her progress.  Continue metformin.   - Refill metFORMIN (GLUCOPHAGE) 500 MG tablet; Take 1 tablet (500 mg total) by mouth daily with supper.  Dispense: 30 tablet; Refill: 0  2. Vitamin D insufficiency Low Vitamin D level contributes to fatigue and are associated with obesity, breast, and colon cancer. She agrees to continue to take  prescription Vitamin D @50 ,000 IU every week and will follow-up for routine testing of Vitamin D, at least 2-3 times per year to avoid over-replacement.  - Refill Vitamin D, Ergocalciferol, (DRISDOL) 1.25 MG (50000 UNIT) CAPS capsule; Take 1 capsule (50,000 Units total) by mouth every 7 (seven) days.  Dispense: 4 capsule; Refill: 0  3. At risk for osteoporosis Leslie Landry was given approximately 15 minutes of osteoporosis prevention counseling today. Leslie Landry is at risk for osteopenia and osteoporosis due to her Vitamin D deficiency. She was encouraged to take her Vitamin D and follow her higher calcium diet and increase strengthening exercise to help strengthen her bones and decrease her risk of osteopenia and osteoporosis.  Repetitive spaced learning was employed today to elicit superior memory formation and behavioral change.  4. Obesity current BMI 19  Leslie Landry is currently in the action stage of change. As such, her goal is to continue with weight loss efforts. She has agreed to keeping a food journal and adhering to recommended goals of 1000 calories and 70-80 grams of protein.   She will work on being more adherent to the plan at lease 80-85%, meal planning, increasing water intake, and Seasonings sheet was given today.  Exercise goals: Continue exercise.  Behavioral modification strategies: increasing lean protein intake, decreasing simple carbohydrates, increasing vegetables, increasing water intake, decreasing eating out, no skipping meals, meal planning and cooking strategies, keeping healthy foods in the home and planning for success.  Leslie Landry has agreed to follow-up with our clinic in 4 weeks. She was informed  of the importance of frequent follow-up visits to maximize her success with intensive lifestyle modifications for her multiple health conditions.   Objective:   Blood pressure (!) 145/87, pulse 81, temperature 97.8 F (36.6 C), height 5\' 8"  (1.727 m), weight 227 lb (103 kg), last  menstrual period 05/01/2011, SpO2 96 %. Body mass index is 34.52 kg/m.  General: Cooperative, alert, well developed, in no acute distress. HEENT: Conjunctivae and lids unremarkable. Cardiovascular: Regular rhythm.  Lungs: Normal work of breathing. Neurologic: No focal deficits.   Lab Results  Component Value Date   INSULIN 37.3 (H) 05/01/2020   Lab Results  Component Value Date   TSH 0.02 (A) 04/14/2019   TSH 0.02 (A) 04/14/2019   Lab Results  Component Value Date   CHOL 285 (A) 04/14/2019   HDL 53 04/14/2019   LDLCALC 187 04/14/2019   TRIG 244 (A) 04/14/2019   Attestation Statements:   Reviewed by clinician on day of visit: allergies, medications, problem list, medical history, surgical history, family history, social history, and previous encounter notes.  I, Water quality scientist, CMA, am acting as Location manager for [employer], DO  I have reviewed the above documentation for accuracy and completeness, and I agree with the above. Jearld Lesch, DO

## 2020-06-26 ENCOUNTER — Ambulatory Visit (INDEPENDENT_AMBULATORY_CARE_PROVIDER_SITE_OTHER): Payer: No Typology Code available for payment source | Admitting: Bariatrics

## 2021-06-04 ENCOUNTER — Other Ambulatory Visit (HOSPITAL_COMMUNITY): Payer: Self-pay | Admitting: Nurse Practitioner

## 2021-06-04 DIAGNOSIS — Z13820 Encounter for screening for osteoporosis: Secondary | ICD-10-CM

## 2021-06-25 ENCOUNTER — Ambulatory Visit (HOSPITAL_COMMUNITY)
Admission: RE | Admit: 2021-06-25 | Discharge: 2021-06-25 | Disposition: A | Discharge status: Home / Self Care | Payer: No Typology Code available for payment source | Source: Ambulatory Visit | Attending: Nurse Practitioner | Admitting: Nurse Practitioner

## 2021-06-25 ENCOUNTER — Other Ambulatory Visit (HOSPITAL_COMMUNITY): Payer: Self-pay

## 2021-06-25 ENCOUNTER — Other Ambulatory Visit: Payer: Self-pay

## 2021-06-25 DIAGNOSIS — Z13820 Encounter for screening for osteoporosis: Secondary | ICD-10-CM | POA: Insufficient documentation

## 2021-11-08 ENCOUNTER — Encounter: Payer: Self-pay | Admitting: Internal Medicine

## 2021-12-10 ENCOUNTER — Ambulatory Visit (AMBULATORY_SURGERY_CENTER): Payer: Self-pay

## 2021-12-10 VITALS — Ht 68.0 in | Wt 240.0 lb

## 2021-12-10 DIAGNOSIS — Z1211 Encounter for screening for malignant neoplasm of colon: Secondary | ICD-10-CM

## 2021-12-10 MED ORDER — NA SULFATE-K SULFATE-MG SULF 17.5-3.13-1.6 GM/177ML PO SOLN: 1.0000 | Freq: Once | ORAL | 0 refills | Status: AC

## 2021-12-10 NOTE — Progress Notes (Signed)

## 2021-12-31 ENCOUNTER — Encounter: Payer: Self-pay | Admitting: Certified Registered Nurse Anesthetist

## 2021-12-31 ENCOUNTER — Encounter: Payer: Self-pay | Admitting: Internal Medicine

## 2022-01-07 ENCOUNTER — Encounter: Payer: Self-pay | Admitting: Internal Medicine

## 2022-01-07 ENCOUNTER — Ambulatory Visit (AMBULATORY_SURGERY_CENTER): Payer: No Typology Code available for payment source | Admitting: Internal Medicine

## 2022-01-07 VITALS — BP 111/69 | HR 69 | Temp 98.6°F | Resp 12 | Ht 68.0 in | Wt 240.0 lb

## 2022-01-07 DIAGNOSIS — Z800 Family history of malignant neoplasm of digestive organs: Secondary | ICD-10-CM | POA: Diagnosis not present

## 2022-01-07 DIAGNOSIS — Z1211 Encounter for screening for malignant neoplasm of colon: Secondary | ICD-10-CM | POA: Diagnosis not present

## 2022-01-07 DIAGNOSIS — D128 Benign neoplasm of rectum: Secondary | ICD-10-CM

## 2022-01-07 DIAGNOSIS — D127 Benign neoplasm of rectosigmoid junction: Secondary | ICD-10-CM | POA: Diagnosis not present

## 2022-01-07 MED ORDER — SODIUM CHLORIDE 0.9 % IV SOLN: 500.0000 mL | Freq: Once | INTRAVENOUS | Status: DC

## 2022-01-07 NOTE — Op Note (Signed)
[place] Patient Name: Leslie Landry Procedure Date: 01/07/2022 9:15 AM MRN: [id number] Endoscopist: Jerene Bears , MD, [id number] Age: 56 Referring MD:  Date of Birth: [birth year] Gender: Female Account #: [id number] Procedure:                Colonoscopy Indications:              Screening in patient at increased risk: Family                            history of 1st-degree relative with colorectal                            cancer (brother), last colonoscopy 9 yrs ago Medicines:                Monitored Anesthesia Care Procedure:                Pre-Anesthesia Assessment:                           - Prior to the procedure, a History and Physical                            was performed, and patient medications and                            allergies were reviewed. The patient's tolerance of                            previous anesthesia was also reviewed. The risks                            and benefits of the procedure and the sedation                            options and risks were discussed with the patient.                            All questions were answered, and informed consent                            was obtained. Prior Anticoagulants: The patient has                            taken no anticoagulant or antiplatelet agents. ASA                            Grade Assessment: II - A patient with mild systemic                            disease. After reviewing the risks and benefits,                            the patient was deemed in satisfactory condition to  undergo the procedure.                           After obtaining informed consent, the colonoscope                            was passed under direct vision. Throughout the                            procedure, the patient's blood pressure, pulse, and                            oxygen saturations were monitored continuously. The                            Olympus CF-HQ190L  (Serial# [id number]) Colonoscope was                            introduced through the anus and advanced to the                            cecum, identified by appendiceal orifice and                            ileocecal valve. The colonoscopy was performed                            without difficulty. The patient tolerated the                            procedure well. The quality of the bowel                            preparation was good. The ileocecal valve,                            appendiceal orifice, and rectum were photographed. Scope In: 9:18:50 AM Scope Out: 9:32:50 AM Scope Withdrawal Time: 0 hours 12 minutes 19 seconds  Total Procedure Duration: 0 hours 14 minutes 0 seconds  Findings:                 The digital rectal exam was normal.                           A 15 mm polyp was found in the recto-sigmoid colon.                            The polyp was semi-pedunculated. The polyp was                            removed with a hot snare. Resection and retrieval                            were complete.  A 7 mm polyp was found in the distal rectum. The                            polyp was sessile. The polyp was removed with a                            cold snare. Hot snare tip was used to cauterize the                            polypectomy edges. Resection and retrieval were                            complete.                           Multiple medium-mouthed and small-mouthed                            diverticula were found in the sigmoid colon and                            ascending colon.                           Internal hemorrhoids were found during                            retroflexion. The hemorrhoids were small. Complications:            No immediate complications. Estimated Blood Loss:     Estimated blood loss was minimal. Impression:               - One 15 mm polyp at the recto-sigmoid colon,                            removed with a  hot snare. Resected and retrieved.                           - One 7 mm polyp in the distal rectum, removed with                            a cold snare. Resected and retrieved.                           - Mild diverticulosis in the sigmoid colon and in                            the ascending colon.                           - Internal hemorrhoids. Recommendation:           - Patient has a contact number available for                            emergencies.  The signs and symptoms of potential                            delayed complications were discussed with the                            patient. Return to normal activities tomorrow.                            Written discharge instructions were provided to the                            patient.                           - Resume previous diet.                           - Continue present medications. Avoid NSAIDs x 2                            weeks.                           - Await pathology results.                           - Repeat colonoscopy is recommended for                            surveillance. The colonoscopy date will be                            determined after pathology results from today's                            exam become available for review. Jerene Bears, MD 01/07/2022 9:37:33 AM This report has been signed electronically.

## 2022-01-07 NOTE — Progress Notes (Signed)
Pt's states no medical or surgical changes since previsit or office visit. 

## 2022-01-07 NOTE — Progress Notes (Signed)
Report given to PACU, vss 

## 2022-01-07 NOTE — Patient Instructions (Addendum)
Handouts Provided:  Polyps and Diverticulosis  - Patient has a contact number available for emergencies. The signs and symptoms of potential delayed complications were discussed with the patient. Return to normal activities tomorrow. Written discharge instructions were provided to the patient. - Resume previous diet. - Continue present medications. Avoid NSAIDs x 2 weeks. - Await pathology results. - Repeat colonoscopy is recommended for surveillance. The colonoscopy date will be determined after pathology results from today's exam become available for review.  YOU HAD AN ENDOSCOPIC PROCEDURE TODAY AT [facility]:   Refer to the procedure report that was given to you for any specific questions about what was found during the examination.  If the procedure report does not answer your questions, please call your gastroenterologist to clarify.  If you requested that your care partner not be given the details of your procedure findings, then the procedure report has been included in a sealed envelope for you to review at your convenience later.  YOU SHOULD EXPECT: Some feelings of bloating in the abdomen. Passage of more gas than usual.  Walking can help get rid of the air that was put into your GI tract during the procedure and reduce the bloating. If you had a lower endoscopy (such as a colonoscopy or flexible sigmoidoscopy) you may notice spotting of blood in your stool or on the toilet paper. If you underwent a bowel prep for your procedure, you may not have a normal bowel movement for a few days.  Please Note:  You might notice some irritation and congestion in your nose or some drainage.  This is from the oxygen used during your procedure.  There is no need for concern and it should clear up in a day or so.  SYMPTOMS TO REPORT IMMEDIATELY:  Following lower endoscopy (colonoscopy or flexible sigmoidoscopy):  Excessive amounts of blood in the stool  Significant tenderness or  worsening of abdominal pains  Swelling of the abdomen that is new, acute  Fever of 100F or higher  For urgent or emergent issues, a gastroenterologist can be reached at any hour by calling [phone]. Do not use MyChart messaging for urgent concerns.    DIET:  We do recommend a small meal at first, but then you may proceed to your regular diet.  Drink plenty of fluids but you should avoid alcoholic beverages for 24 hours.  ACTIVITY:  You should plan to take it easy for the rest of today and you should NOT DRIVE or use heavy machinery until tomorrow (because of the sedation medicines used during the test).    FOLLOW UP: Our staff will call the number listed on your records the next business day following your procedure.  We will call around 7:15- 8:00 am to check on you and address any questions or concerns that you may have regarding the information given to you following your procedure. If we do not reach you, we will leave a message.     If any biopsies were taken you will be contacted by phone or by letter within the next 1-3 weeks.  Please call us at [phone] if you have not heard about the biopsies in 3 weeks.    SIGNATURES/CONFIDENTIALITY: You and/or your care partner have signed paperwork which will be entered into your electronic medical record.  These signatures attest to the fact that that the information above on your After Visit Summary has been reviewed and is understood.  Full responsibility of the confidentiality of this  discharge information lies with you and/or your care-partner.

## 2022-01-07 NOTE — Progress Notes (Signed)
Called to room to assist during endoscopic procedure.  Patient ID and intended procedure confirmed with present staff. Received instructions for my participation in the procedure from the performing physician.  

## 2022-01-07 NOTE — Progress Notes (Signed)
GASTROENTEROLOGY PROCEDURE H&P NOTE   Primary Care Physician: Celene Squibb, MD    Reason for Procedure:   Colon cancer screening  Plan:    colonoscopy  Patient is appropriate for endoscopic procedure(s) in the ambulatory ([place]) setting.  The nature of the procedure, as well as the risks, benefits, and alternatives were carefully and thoroughly reviewed with the patient. Ample time for discussion and questions allowed. The patient understood, was satisfied, and agreed to proceed.     HPI: Leslie Landry is a 56 y.o. female who presents for screening colonoscopy.  Medical history as below.  Tolerated the prep.  No recent chest pain or shortness of breath.  No abdominal pain today. FH colon cancer - brother. Last colonoscopy age 1 in [place].  Past Medical History:  Diagnosis Date   Allergy    Anxiety    Constipation    Hashimoto's disease    High cholesterol    Hyperlipidemia    Hypothyroidism    Lichen sclerosus    Osteoporosis     Past Surgical History:  Procedure Laterality Date   CERVIX SURGERY     CESAREAN SECTION      Prior to Admission medications   Medication Sig Start Date End Date Taking? Authorizing Provider  alendronate (FOSAMAX) 70 MG tablet Take 70 mg by mouth once a week. 11/20/21  Yes [provider]  COD LIVER OIL PO Take 415 mg by mouth daily at 12 noon.   Yes [provider]  estradiol (ESTRACE VAGINAL) 0.1 MG/GM vaginal cream Insert by vaginal route. 11/06/21  Yes [provider]  estradiol (ESTRACE) 0.1 MG/GM vaginal cream Place 1 g vaginally daily. 09/30/21  Yes [provider]  levothyroxine (SYNTHROID) 112 MCG tablet Take 112 mcg by mouth daily before breakfast.   Yes [provider]  Menaquinone-7 (VITAMIN K2) 100 MCG CAPS Take by mouth.   Yes [provider]  rosuvastatin (CRESTOR) 20 MG tablet Take 20 mg by mouth at bedtime. 05/05/19  Yes [provider]  terbinafine (LAMISIL) 250  MG tablet Take 250 mg by mouth daily. 11/14/21  Yes [provider]  Vitamin D, Ergocalciferol, (DRISDOL) 1.25 MG (50000 UNIT) CAPS capsule Take 1 capsule (50,000 Units total) by mouth every 7 (seven) days. 05/29/20  Yes Jearld Lesch A, DO  Fenofibrate 50 MG CAPS Take 50 mg by mouth daily at 12 noon. Patient not taking: Reported on 12/10/2021    [provider]  metFORMIN (GLUCOPHAGE) 500 MG tablet Take 1 tablet (500 mg total) by mouth daily with supper. Patient not taking: Reported on 12/10/2021 05/29/20   Georgia Lopes, DO    Current Outpatient Medications  Medication Sig Dispense Refill   alendronate (FOSAMAX) 70 MG tablet Take 70 mg by mouth once a week.     COD LIVER OIL PO Take 415 mg by mouth daily at 12 noon.     estradiol (ESTRACE VAGINAL) 0.1 MG/GM vaginal cream Insert by vaginal route.     estradiol (ESTRACE) 0.1 MG/GM vaginal cream Place 1 g vaginally daily.     levothyroxine (SYNTHROID) 112 MCG tablet Take 112 mcg by mouth daily before breakfast.     Menaquinone-7 (VITAMIN K2) 100 MCG CAPS Take by mouth.     rosuvastatin (CRESTOR) 20 MG tablet Take 20 mg by mouth at bedtime.     terbinafine (LAMISIL) 250 MG tablet Take 250 mg by mouth daily.     Vitamin D, Ergocalciferol, (DRISDOL) 1.25 MG (50000  UNIT) CAPS capsule Take 1 capsule (50,000 Units total) by mouth every 7 (seven) days. 4 capsule 0   Fenofibrate 50 MG CAPS Take 50 mg by mouth daily at 12 noon. (Patient not taking: Reported on 12/10/2021)     metFORMIN (GLUCOPHAGE) 500 MG tablet Take 1 tablet (500 mg total) by mouth daily with supper. (Patient not taking: Reported on 12/10/2021) 30 tablet 0   Current Facility-Administered Medications  Medication Dose Route Frequency Provider Last Rate Last Admin   0.9 %  sodium chloride infusion  500 mL Intravenous Once Corianna Avallone, Lajuan Lines, MD        Allergies as of 01/07/2022 - Review Complete 01/07/2022  Allergen Reaction Noted   Hydrocodone  05/31/2019    Family  History  Problem Relation Age of Onset   Diabetes Mother    Thyroid disease Mother    Hypertension Mother    Heart failure Mother    Heart disease Mother    Obesity Mother    Hypertension Father    Hyperlipidemia Father    Stroke Father    Cancer Father    Obesity Father    Colon polyps Sister    Colon cancer Brother    Esophageal cancer Neg Hx    Stomach cancer Neg Hx    Rectal cancer Neg Hx     Social History   Socioeconomic History   Marital status: Single    Spouse name: Not on file   Number of children: Not on file   Years of education: Not on file   Highest education level: Not on file  Occupational History   Occupation: Mail carrier  Tobacco Use   Smoking status: Former    Years: 5.00    Types: Cigarettes    Quit date: 1987    Years since quitting: 36.9   Smokeless tobacco: Never  Vaping Use   Vaping Use: Never used  Substance and Sexual Activity   Alcohol use: Yes    Alcohol/week: 2.0 standard drinks of alcohol    Types: 2 Glasses of wine per week    Comment: ocasional   Drug use: Never   Sexual activity: Not on file  Other Topics Concern   Not on file  Social History Narrative   Not on file   Social Determinants of Health   Financial Resource Strain: Not on file  Food Insecurity: Not on file  Transportation Needs: Not on file  Physical Activity: Not on file  Stress: Not on file  Social Connections: Not on file  Intimate Partner Violence: Not on file    Physical Exam: Vital signs in last 24 hours: '@BP'$  126/84   Pulse 75   Temp 98.6 F (37 C) (Temporal)   Ht '5\' 8"'$  (1.727 m)   Wt 240 lb (108.9 kg)   LMP 05/01/2011   SpO2 95%   BMI 36.49 kg/m  GEN: NAD EYE: Sclerae anicteric ENT: MMM CV: Non-tachycardic Pulm: CTA b/l GI: Soft, NT/ND NEURO:  Alert & Oriented x 3   Zenovia Jarred, MD [facility]  01/07/2022 9:08 AM

## 2022-01-08 ENCOUNTER — Telehealth: Payer: Self-pay

## 2022-01-08 NOTE — Telephone Encounter (Signed)
Follow up call placed, VM obtained and message left. 

## 2022-01-17 ENCOUNTER — Encounter: Payer: Self-pay | Admitting: Internal Medicine

## 2023-07-22 ENCOUNTER — Other Ambulatory Visit (HOSPITAL_COMMUNITY): Payer: Self-pay | Admitting: Internal Medicine

## 2023-07-22 DIAGNOSIS — Z1231 Encounter for screening mammogram for malignant neoplasm of breast: Secondary | ICD-10-CM

## 2023-07-29 ENCOUNTER — Ambulatory Visit (HOSPITAL_COMMUNITY)
Admission: RE | Admit: 2023-07-29 | Discharge: 2023-07-29 | Disposition: A | Discharge status: Home / Self Care | Source: Ambulatory Visit | Attending: Internal Medicine | Admitting: Internal Medicine

## 2023-07-29 DIAGNOSIS — Z1231 Encounter for screening mammogram for malignant neoplasm of breast: Secondary | ICD-10-CM | POA: Insufficient documentation
